# Patient Record
Sex: Male | Born: 1955 | Race: Black or African American | Hispanic: No | Marital: Single | Smoking: Never smoker
Health system: Southern US, Community
[De-identification: ages and names within clinical notes are randomized; demographics above are authoritative.]

## PROBLEM LIST (undated history)

## (undated) DIAGNOSIS — T7840XA Allergy, unspecified, initial encounter: Secondary | ICD-10-CM

## (undated) DIAGNOSIS — N200 Calculus of kidney: Secondary | ICD-10-CM

## (undated) DIAGNOSIS — J301 Allergic rhinitis due to pollen: Secondary | ICD-10-CM

## (undated) DIAGNOSIS — M79645 Pain in left finger(s): Secondary | ICD-10-CM

## (undated) HISTORY — DX: Pain in left finger(s): M79.645

## (undated) HISTORY — DX: Calculus of kidney: N20.0

## (undated) HISTORY — PX: OTHER SURGICAL HISTORY: SHX169

## (undated) HISTORY — DX: Allergy, unspecified, initial encounter: T78.40XA

---
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
## 2008-01-08 ENCOUNTER — Emergency Department (HOSPITAL_COMMUNITY): Admission: EM | Admit: 2008-01-08 | Discharge: 2008-01-08 | Payer: Self-pay | Admitting: Emergency Medicine

## 2008-01-23 ENCOUNTER — Emergency Department (HOSPITAL_COMMUNITY): Admission: EM | Admit: 2008-01-23 | Discharge: 2008-01-23 | Payer: Self-pay | Admitting: Emergency Medicine

## 2009-03-03 ENCOUNTER — Ambulatory Visit: Payer: Self-pay | Admitting: Internal Medicine

## 2009-04-08 ENCOUNTER — Ambulatory Visit: Payer: Self-pay | Admitting: Family Medicine

## 2009-10-12 DIAGNOSIS — N200 Calculus of kidney: Secondary | ICD-10-CM

## 2009-10-12 HISTORY — DX: Calculus of kidney: N20.0

## 2010-07-01 ENCOUNTER — Emergency Department: Payer: Self-pay | Admitting: Emergency Medicine

## 2011-07-06 LAB — CBC
Platelets: 194
RDW: 13.1
WBC: 8.8

## 2011-07-06 LAB — DIFFERENTIAL
Basophils Absolute: 0
Lymphocytes Relative: 22
Lymphs Abs: 1.9
Neutrophils Relative %: 68

## 2011-07-06 LAB — URINALYSIS, ROUTINE W REFLEX MICROSCOPIC
Ketones, ur: NEGATIVE
Nitrite: NEGATIVE
Protein, ur: NEGATIVE
Urobilinogen, UA: 0.2
pH: 6

## 2011-07-06 LAB — BASIC METABOLIC PANEL
BUN: 13
Calcium: 9
Creatinine, Ser: 1.11
GFR calc non Af Amer: >60

## 2011-07-07 LAB — URINALYSIS, ROUTINE W REFLEX MICROSCOPIC
Glucose, UA: NEGATIVE
Leukocytes, UA: NEGATIVE
Nitrite: NEGATIVE
Protein, ur: NEGATIVE
pH: 6

## 2011-07-07 LAB — URINE MICROSCOPIC-ADD ON

## 2011-07-07 LAB — URINE CULTURE: Culture: NO GROWTH

## 2011-10-30 ENCOUNTER — Encounter: Payer: Self-pay | Admitting: Family Medicine

## 2011-11-16 ENCOUNTER — Ambulatory Visit (INDEPENDENT_AMBULATORY_CARE_PROVIDER_SITE_OTHER): Payer: Self-pay | Admitting: Family Medicine

## 2011-11-16 ENCOUNTER — Encounter: Payer: Self-pay | Admitting: Family Medicine

## 2011-11-16 DIAGNOSIS — Z23 Encounter for immunization: Secondary | ICD-10-CM

## 2011-11-16 DIAGNOSIS — J309 Allergic rhinitis, unspecified: Secondary | ICD-10-CM | POA: Insufficient documentation

## 2011-11-16 DIAGNOSIS — A6002 Herpesviral infection of other male genital organs: Secondary | ICD-10-CM

## 2011-11-16 DIAGNOSIS — A6000 Herpesviral infection of urogenital system, unspecified: Secondary | ICD-10-CM

## 2011-11-16 DIAGNOSIS — Z0000 Encounter for general adult medical examination without abnormal findings: Secondary | ICD-10-CM

## 2011-11-16 LAB — COMPREHENSIVE METABOLIC PANEL
ALT: 34 U/L (ref 0–53)
AST: 22 U/L (ref 0–37)
Albumin: 4.3 g/dL (ref 3.5–5.2)
Alkaline Phosphatase: 65 U/L (ref 39–117)
BUN: 17 mg/dL (ref 6–23)
Calcium: 9.3 mg/dL (ref 8.4–10.5)
Chloride: 106 mEq/L (ref 96–112)
Potassium: 4.3 mEq/L (ref 3.5–5.3)
Sodium: 140 mEq/L (ref 135–145)
Total Protein: 7 g/dL (ref 6.0–8.3)

## 2011-11-16 LAB — CBC
Platelets: 195 10*3/uL (ref 150–400)
RBC: 5.76 MIL/uL (ref 4.22–5.81)
RDW: 13.4 % (ref 11.5–15.5)
WBC: 7.8 10*3/uL (ref 4.0–10.5)

## 2011-11-16 LAB — POCT GLYCOSYLATED HEMOGLOBIN (HGB A1C): Hemoglobin A1C: 5.8

## 2011-11-16 LAB — LIPID PANEL
HDL: 39 mg/dL — ABNORMAL LOW (ref >39)
LDL Cholesterol: 132 mg/dL — ABNORMAL HIGH (ref 0–99)

## 2011-11-16 MED ORDER — SODIUM CHLORIDE 0.65 % NA SOLN: 1.0000 | NASAL | Status: DC | PRN

## 2011-11-16 MED ORDER — VALACYCLOVIR HCL 1 G PO TABS: 1000.0000 mg | ORAL_TABLET | Freq: Every day | ORAL | Status: DC

## 2011-11-16 MED ORDER — FLUTICASONE PROPIONATE 50 MCG/ACT NA SUSP: 2.0000 | Freq: Every day | NASAL | Status: DC

## 2011-11-16 NOTE — Progress Notes (Signed)
  Subjective:    Patient ID: Roger Hart, male    DOB: 1955-10-13, 56 y.o.   MRN: [id number]  HPI 56 yo M new patient here to establish primary care an discuss the following:  1. Allergies: chronic. Usually during winter months. Patient is usually treated with a dose of antibiotics although he does not endorse associated fever or purulent nasal discharge. Major features are R frontal and paranasal sinus pain and bilateral. He also has droopy and dark under eyes.   2. Lesions on penis: present off and on for 10 years. Raw feeling proceeds the development of small lesion that form small sores. He denies ithching, burning or urethral discharge.    Review of Systems  Constitutional: Negative for fever, chills, diaphoresis, activity change, appetite change, fatigue and unexpected weight change.  HENT: Positive for congestion, facial swelling, rhinorrhea, sneezing, neck pain, neck stiffness, postnasal drip and sinus pressure. Negative for hearing loss, ear pain, nosebleeds, sore throat, drooling, mouth sores, trouble swallowing, dental problem, voice change, tinnitus and ear discharge.        L sided neck pain.    Eyes: Positive for redness, itching and visual disturbance. Negative for photophobia, pain and discharge.       Difficulty seeing smaller letters when reading.   Respiratory: Negative.   Cardiovascular: Negative.   Gastrointestinal: Negative.        Gassy.   Genitourinary: Positive for genital sores. Negative for dysuria, urgency, frequency, hematuria, flank pain, decreased urine volume, discharge, penile swelling, scrotal swelling, enuresis, difficulty urinating, penile pain and testicular pain.  Musculoskeletal: Positive for myalgias and arthralgias. Negative for back pain, joint swelling and gait problem.       R thumb.  L arm comes and goes.   Skin: Positive for rash. Negative for color change, pallor and wound.       Small blisters penis go and come. Can be a little painful. No  urethral discharge.   Neurological: Positive for numbness and headaches. Negative for dizziness, tremors, seizures, syncope, facial asymmetry, speech difficulty, weakness and light-headedness.       Sinus area headaches.  Occasional leg numbness, like it goes to sleeps (both legs sometimes).   Hematological: Negative.   Psychiatric/Behavioral: Negative.       Objective:   Physical Exam BP 130/80  Pulse 76  Temp(Src) 98.6 F (37 C) (Oral)  Ht 5\' 11"  (1.803 m)  Wt 244 lb 8 oz (110.904 kg)  BMI 34.10 kg/m2 General appearance: alert, cooperative and no distress Head: Normocephalic, without obvious abnormality, atraumatic, sinuses nontender to percussion Eyes: positive findings: eyelids/periorbital: lower eyelid fullness and darkness.  Nose: Nares normal. Septum midline. Mucosa edema.  No drainage or sinus tenderness. Throat: lips, mucosa, and tongue normal; teeth and gums normal Neck: no adenopathy, no carotid bruit, no JVD, supple, symmetrical, trachea midline and thyroid not enlarged, symmetric, no tenderness/mass/nodules Lungs: clear to auscultation bilaterally Heart: regular rate and rhythm, S1, S2 normal, no murmur, click, rub or gallop Male genitalia: abnormal findings: shallow ulcer on distal shaft ( R sided). Uncircumcised. No debris. Normalpalable tesets, nontender. No hernia. Pulses: 2+ and symmetric Neurologic: Grossly normal    Assessment & Plan:

## 2011-11-16 NOTE — Patient Instructions (Addendum)
[name],  Thank you for coming in to see me today.  Please pick up the flonase and use nasal saline liberally.   Fo genital herpes take Valtrex:   Initial episode: 1 g twice daily for 10 days  Recurrent episode: 500 mg twice daily for 3 days   Dr. Armen Pickup   Genital Herpes Genital herpes is a sexually transmitted disease. This means that it is a disease passed by having sex with an infected person. There is no cure for genital herpes. The time between attacks can be months to years. The virus may live in a person but produce no problems (symptoms). This infection can be passed to a baby as it travels down the birth canal (vagina). In a newborn, this can cause central nervous system damage, eye damage, or even death. The virus that causes genital herpes is usually HSV-2 virus. The virus that causes oral herpes is usually HSV-1. The diagnosis (learning what is wrong) is made through culture results. SYMPTOMS  Usually symptoms of pain and itching begin a few days to a week after contact. It first appears as small blisters that progress to small painful ulcers which then scab over and heal after several days. It affects the outer genitalia, birth canal, cervix, penis, anal area, buttocks, and thighs. HOME CARE INSTRUCTIONS      Keep ulcerated areas dry and clean.   Take medications as directed. Antiviral medications can speed up healing. They will not prevent recurrences or cure this infection. These medications can also be taken for suppression if there are frequent recurrences.   While the infection is active, it is contagious. Avoid all sexual contact during active infections.   Condoms may help prevent spread of the herpes virus.   Practice safe sex.   Wash your hands thoroughly after touching the genital area.   Avoid touching your eyes after touching your genital area.   Inform your caregiver if you have had genital herpes and become pregnant. It is your responsibility to insure  a safe outcome for your baby in this pregnancy.   Only take over-the-counter or prescription medicines for pain, discomfort, or fever as directed by your caregiver.  SEEK MEDICAL CARE IF:   You have a recurrence of this infection.   You do not respond to medications and are not improving.   You have new sources of pain or discharge which have changed from the original infection.   You have an oral temperature above 102 F (38.9 C).   You develop abdominal pain.   You develop eye pain or signs of eye infection.  Document Released: 09/25/2000 Document Revised: 06/10/2011 Document Reviewed: 10/16/2009 [facility] Patient Information 2012 [place], [place].    Are there any precautions when using this drug?  . ZOX:WRUE:A540:[id number] If you have kidney disease, talk with your doctor.  . WGN:FAOZ:H086:[id number] Check all drugs you are taking with your doctor. This drug may not mix well with some other drugs.  . XBM:WUXL:K440:[id number] Keep a list of all your drugs (prescription, natural products, vitamins, OTC) with you. Give this list to your doctor.  . UYQ:IHKV:Q259:[id number] Tell your doctor if you are pregnant or plan on getting pregnant.  . PPI:RJJO:A416:[id number] Tell your doctor if you are breast-feeding. What are some side effects of this drug?  . FUX:NATF:T732:[id number] Headache.  . WCB:JSEG:B151:[id number] Upset stomach or throwing up. Many small meals, good mouth care, sucking hard, sugar-free candy, or chewing sugar-free gum may help.  . TGG:YIRS:W546:[id number] Belly pain.  . FGH:WEXH:B716:[id number] Low white  blood cell count.  . ZOX:WRUE:A540:[id number] Raised liver enzymes. When do I need to call my doctor   Valtrex: Are there any precautions when using this drug?  . WGN:FAOZ:H086:[id number] If you have kidney disease, talk with your doctor.  . XBM:WUXL:K440:[id number] Check all drugs you are taking with your doctor. This drug may not mix well with some other drugs.  .  UYQ:IHKV:Q259:[id number] Keep a list of all your drugs (prescription, natural products, vitamins, OTC) with you. Give this list to your doctor.  . PPI:RJJO:A416:[id number] Tell your doctor if you are pregnant or plan on getting pregnant.  . FUX:NATF:T732:[id number] Tell your doctor if you are breast-feeding. What are some side effects of this drug?  . WCB:JSEG:B151:[id number] Headache.  . TGG:YIRS:W546:[id number] Upset stomach or throwing up. Many small meals, good mouth care, sucking hard, sugar-free candy, or chewing sugar-free gum may help.  . FGH:WEXH:B716:R6789381 Belly pain.  . OFB:PZWC:H852:[id number] Low white blood cell count.  . TIR:WERX:V400:[id number] Raised liver enzymes.

## 2011-11-17 LAB — RPR

## 2011-11-17 LAB — HIV ANTIBODY (ROUTINE TESTING W REFLEX): HIV: NONREACTIVE

## 2011-11-23 ENCOUNTER — Telehealth: Payer: Self-pay | Admitting: Family Medicine

## 2011-11-23 NOTE — Telephone Encounter (Signed)
Left VM. Informed patient that A1c and cholesterol were wnl. Other labs (HIV and RPR) negative. Asked patient to call back if he wanted a letter or had any questions about his blood work.

## 2011-11-26 NOTE — Assessment & Plan Note (Signed)
A: suspect allergic rhinitis. Patient afebrile, no evidence of active infection. P:  -flonase -liberal use of nasal saline.

## 2011-11-26 NOTE — Assessment & Plan Note (Addendum)
A: lesion on penis consistent with genital herpes ulcer.  P: treat with valcyclovir. Discussed treatment for recurrent outbreaks. Monitor response to treatment.

## 2012-02-03 ENCOUNTER — Ambulatory Visit (HOSPITAL_COMMUNITY)
Admission: RE | Admit: 2012-02-03 | Discharge: 2012-02-03 | Disposition: A | Discharge status: Home / Self Care | Payer: Self-pay | Source: Ambulatory Visit | Attending: Family Medicine | Admitting: Family Medicine

## 2012-02-03 ENCOUNTER — Ambulatory Visit (INDEPENDENT_AMBULATORY_CARE_PROVIDER_SITE_OTHER): Payer: Self-pay | Admitting: Family Medicine

## 2012-02-03 ENCOUNTER — Telehealth: Payer: Self-pay | Admitting: Family Medicine

## 2012-02-03 VITALS — BP 118/77 | HR 73 | Temp 97.8°F | Ht 71.0 in | Wt 244.4 lb

## 2012-02-03 DIAGNOSIS — M79645 Pain in left finger(s): Secondary | ICD-10-CM | POA: Insufficient documentation

## 2012-02-03 DIAGNOSIS — X58XXXA Exposure to other specified factors, initial encounter: Secondary | ICD-10-CM | POA: Insufficient documentation

## 2012-02-03 DIAGNOSIS — M2569 Stiffness of other specified joint, not elsewhere classified: Secondary | ICD-10-CM | POA: Insufficient documentation

## 2012-02-03 DIAGNOSIS — S62639A Displaced fracture of distal phalanx of unspecified finger, initial encounter for closed fracture: Secondary | ICD-10-CM | POA: Insufficient documentation

## 2012-02-03 DIAGNOSIS — M79609 Pain in unspecified limb: Secondary | ICD-10-CM | POA: Insufficient documentation

## 2012-02-03 HISTORY — DX: Pain in left finger(s): M79.645

## 2012-02-03 NOTE — Assessment & Plan Note (Signed)
Fracture of left 5th digit on volar surface of distal phalanx. I recommend splinting for 2-3 weeks and avoiding impact if possible. Follow-up for persistent pain.

## 2012-02-03 NOTE — Progress Notes (Signed)
  Subjective:    Patient ID: Roger Hart, male    DOB: 09-28-1956, 56 y.o.   MRN: [id number]  HPI Patient caught left hand between two wooden panels of a garage door as it was closing. This occurred this morning. Currently he has pain in the DIP joints and finger tips of index, middle, and ring fingers of his left hand. There was no laceration or deformity. 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 Additionally, he is concerned about the thumb of his right hand, because he has difficulty flexing it at the IP joint. This was has been long standing and not related to today's injury to the left hand. He denies previous injury or surgery to the right hand.    Review of Systems Negative unless stated in HPI.     Objective:   Physical Exam BP 118/77  Pulse 73  Temp(Src) 97.8 F (36.6 C) (Oral)  Ht 5\' 11"  (1.803 m)  Wt 244 lb 6.4 oz (110.859 kg)  BMI 34.09 kg/m2 Gen: alert, well appearind Hands: intact pulses of right of right hand; no lacerations; mild bruising of the base of the nail bed at the index and middle fingers; no obvious deformity; decreased active flexion of DIP joints of middle 3 fingers of left compared to right; Right hand - cannot active flex IP joint of thumb; no obvious deformity or evidence of arthritis   X-ray left hand Small fracture fragment at the volar base of the left fifth distal  phalanx. No other acute fracture or dislocation identified in the  left hand.  X-ray right thumb Negative for injury to thumb    Assessment & Plan:  56 year old man who sustained fracture of the left 5th digit at the base of the distal phalanx.

## 2012-02-03 NOTE — Telephone Encounter (Signed)
I called Roger Hart to discuss the result of his X-ray. He did not answer, so I left a message with the following details. The X-ray demonstrated a very small non-displaced fracture of the left 5th digit. I recommended that he splint it. He was also instructed to call the clinic with questions. Since I working in the hospital all day tomorrow, any questions about this patient should be routed to a preceptor in the clinic. Thank you.

## 2012-02-03 NOTE — Patient Instructions (Signed)
Dear Mr. Pitner,   Thank you for coming to clinic today. Please read below regarding the issues that we discussed.   1. Finger caught in the garage door - I recommend an X-ray to make sure that there is not a fracture.  2. Inability to flex the thumb - This could be the result of arthritis or in injury to the tendon on your thumb. Most likely, it is arthritis. We will get an x-ray of this as well.   I will call you with the results of the X-ray and let you know what needs to be done if they are fractured. Please follow up in clinic in as needed to see Dr. Armen Pickup. Please call earlier if you have any questions or concerns.   Sincerely,   Dr. Clinton Sawyer

## 2012-02-04 ENCOUNTER — Telehealth: Payer: Self-pay | Admitting: Family Medicine

## 2012-02-04 NOTE — Telephone Encounter (Signed)
Patient told of fracture to left 5th digit. He states that it is already splinted. Also discussed normal X-ray of right thumb. If he still has problems, he can discuss referral to hand surgeon with PCP at next visit.

## 2012-02-04 NOTE — Telephone Encounter (Signed)
Discussed note with Dr. Mauricio Po.  Per Dr. Awilda Metro Dr. Clinton Sawyer is not post-call, will route note back to him to contact patient to discuss x-ray results.  Gaylene Brooks, RN

## 2012-02-04 NOTE — Telephone Encounter (Signed)
Returned call to patient.  States his middle finger was smashed yesterday and the nail looks different.  Not sure if he needs to have anything done to it.  Will route note to Dr. Mauricio Po (Preceptor) to discuss results with patient per Dr. Yetta Numbers request.  Gaylene Brooks, RN .

## 2012-02-04 NOTE — Telephone Encounter (Signed)
Pt smashed his hand and was seen yesterday and has a question about one of the nails on his middle finger. Wants to talk to nurse

## 2012-05-02 ENCOUNTER — Encounter (HOSPITAL_COMMUNITY): Payer: Self-pay | Admitting: *Deleted

## 2012-05-02 ENCOUNTER — Emergency Department (HOSPITAL_COMMUNITY)
Admission: EM | Admit: 2012-05-02 | Discharge: 2012-05-02 | Disposition: A | Discharge status: Home / Self Care | Payer: Self-pay | Attending: Emergency Medicine | Admitting: Emergency Medicine

## 2012-05-02 DIAGNOSIS — M436 Torticollis: Secondary | ICD-10-CM | POA: Insufficient documentation

## 2012-05-02 MED ORDER — METHOCARBAMOL 500 MG PO TABS: 500.0000 mg | ORAL_TABLET | Freq: Three times a day (TID) | ORAL | Status: AC

## 2012-05-02 MED ORDER — IBUPROFEN 800 MG PO TABS: 800.0000 mg | ORAL_TABLET | Freq: Three times a day (TID) | ORAL | Status: AC

## 2012-05-02 MED ORDER — KETOROLAC TROMETHAMINE 60 MG/2ML IM SOLN
60.0000 mg | Freq: Once | INTRAMUSCULAR | Status: AC
  Administered 2012-05-02: 60 mg via INTRAMUSCULAR
  Filled 2012-05-02: qty 2

## 2012-05-02 MED ORDER — DIAZEPAM 5 MG PO TABS
5.0000 mg | ORAL_TABLET | Freq: Once | ORAL | Status: AC
  Administered 2012-05-02: 5 mg via ORAL
  Filled 2012-05-02: qty 1

## 2012-05-02 NOTE — ED Notes (Signed)
Pt verbalizes understanding 

## 2012-05-02 NOTE — ED Provider Notes (Signed)
History     CSN: [id number]  Arrival date & time 05/02/12  1956   First MD Initiated Contact with Patient 05/02/12 2138      Chief Complaint  Patient presents with  . Neck Pain    (Consider location/radiation/quality/duration/timing/severity/associated sxs/prior treatment) HPI Comments: Patient presents with left-sided neck pain. He reports that this has been intermittent in nature for the past 3-4 weeks. Today, he developed increased pain to the side of his neck which has been more intense than previous. No known injury. He reports increased pain with range of motion of his neck. Pain radiates down to his shoulder. Pt denies CP, SOB.  Patient is a 56 y.o. male presenting with neck injury. The history is provided by the patient.  Neck Injury This is a new problem. The current episode started yesterday. The problem occurs constantly. The problem has been gradually worsening. Associated symptoms include neck pain. Pertinent negatives include no chest pain, fever, nausea, numbness, rash or weakness. Nothing aggravates the symptoms. He has tried nothing for the symptoms. The treatment provided no relief.    Past Medical History  Diagnosis Date  . Allergy 1960    hay fever   . Kidney stones 2011    Past Surgical History  Procedure Date  . None     Family History  Problem Relation Age of Onset  . Asthma Mother   . Cancer Mother 29    uterine cancer   . Seizures Mother   . Cancer Maternal Aunt   . Dementia Father 59    Alzheimers   . Cancer Maternal Uncle     Colon cancer, multiple individuals.     History  Substance Use Topics  . Smoking status: Never Smoker   . Smokeless tobacco: Never Used  . Alcohol Use: Yes     socially, once a month.       Review of Systems  Constitutional: Negative for fever.  HENT: Positive for neck pain.   Cardiovascular: Negative for chest pain.  Gastrointestinal: Negative for nausea.  Skin: Negative for rash.  Neurological: Negative  for weakness and numbness.    Allergies  Review of patient's allergies indicates no known allergies.  Home Medications   Current Outpatient Rx  Name Route Sig Dispense Refill  . FLUTICASONE PROPIONATE 50 MCG/ACT NA SUSP Nasal Place 2 sprays into the nose daily. 16 g 6  . SODIUM CHLORIDE 0.65 % NA SOLN Nasal Place 1 spray into the nose as needed for congestion. 15 mL 12    BP 136/84  Pulse 62  Temp 98.8 F (37.1 C)  Resp 20  SpO2 97%  Physical Exam  Nursing note and vitals reviewed. Constitutional: He appears well-developed and well-nourished. No distress.  HENT:  Head: Normocephalic and atraumatic.  Right Ear: External ear normal.  Left Ear: External ear normal.  Mouth/Throat: Oropharynx is clear and moist.  Eyes:       Normal appearance  Neck: Normal range of motion.       Pt with spasm of neck - head tilted toward left shoulder. Negative meningeal signs, no nuchal rigidity. Pt able to range chin to chest without difficulty. Pain elicited with attempting to touch R ear to shoulder. Palp spasm along trapezius  Cardiovascular: Normal rate, regular rhythm and normal heart sounds.   Pulmonary/Chest: Effort normal and breath sounds normal. He exhibits no tenderness.  Musculoskeletal: Normal range of motion.  Neurological: He is alert.  Skin: Skin is warm and dry. He is not  diaphoretic.  Psychiatric: He has a normal mood and affect.    ED Course  Procedures (including critical care time)  Labs Reviewed - No data to display No results found.   1. Torticollis       MDM  Pt's presentation appears c/w torticollis; palp spasm of trap. Prescriptions given for ibuprofen, musc relaxers. Instructed to f/u with PCP should sx persist. RICE discussed. Reasons to return to ED discussed.       Grant Fontana, PA-C 05/04/12 0105

## 2012-05-02 NOTE — ED Notes (Signed)
Pt c/o stiffness on left side of neck down to shoulder x 3-4 wks.  No known injury.  Today increased pain around back of neck; increased pain with turning neck

## 2012-05-04 NOTE — ED Provider Notes (Signed)
Medical screening examination/treatment/procedure(s) were performed by non-physician practitioner and as supervising physician I was immediately available for consultation/collaboration.  Gerhard Munch, MD 05/04/12 2351

## 2012-06-16 ENCOUNTER — Encounter: Payer: Self-pay | Admitting: Family Medicine

## 2012-06-16 ENCOUNTER — Ambulatory Visit (INDEPENDENT_AMBULATORY_CARE_PROVIDER_SITE_OTHER): Payer: Self-pay | Admitting: Family Medicine

## 2012-06-16 VITALS — BP 132/80 | HR 69 | Temp 98.1°F | Ht 71.0 in | Wt 244.0 lb

## 2012-06-16 DIAGNOSIS — M542 Cervicalgia: Secondary | ICD-10-CM | POA: Insufficient documentation

## 2012-06-16 DIAGNOSIS — J309 Allergic rhinitis, unspecified: Secondary | ICD-10-CM

## 2012-06-16 MED ORDER — IBUPROFEN 800 MG PO TABS: 800.0000 mg | ORAL_TABLET | Freq: Three times a day (TID) | ORAL | Status: DC | PRN

## 2012-06-16 MED ORDER — FLUTICASONE PROPIONATE 50 MCG/ACT NA SUSP: 2.0000 | Freq: Every day | NASAL | Status: DC

## 2012-06-16 MED ORDER — METHOCARBAMOL 500 MG PO TABS: 500.0000 mg | ORAL_TABLET | Freq: Three times a day (TID) | ORAL | Status: DC | PRN

## 2012-06-16 NOTE — Patient Instructions (Addendum)
[name],  Thank you for coming in today. Please restart flonase.   I have refilled robaxin and motrin to take as needed.  If you develop worsening drainage, headache or fever call and come in as this likely means you need a course of antibiotics.  Dr. Armen Pickup

## 2012-06-16 NOTE — Assessment & Plan Note (Signed)
A: persistent. Improved with nasal saline. No evidence of bacterial sinusitis.  P:  Continue nasal saline Restart flonase.

## 2012-06-16 NOTE — Progress Notes (Signed)
Subjective:     Patient ID: Roger Hart, male   DOB: [birth year], 56 y.o.   MRN: [id number]  HPI 56 yo presents for f/u visit to discuss the following:  1. Neck pain: started 05/02/12. Improved with robaxin and advil. Also improved when patient stopped sleeping on the floor. Still has occasional pain but not as severe as before.   2. Nasal congestion/rhinorrhea: chronic. Worsened last month. Associated with L frontal headache. Improved with nasal saline. Patient not taking flonase. Denies fever.   Review of Systems As per HPI    Objective:   Physical Exam BP 132/80  Pulse 69  Temp 98.1 F (36.7 C) (Oral)  Ht 5\' 11"  (1.803 m)  Wt 244 lb (110.678 kg)  BMI 34.03 kg/m2 General appearance: alert, cooperative and no distress Head: Normocephalic, without obvious abnormality, atraumatic Eyes: conjunctivae/corneas clear. PERRL, EOM's intact.  Ears: normal TM's and external ear canals both ears Nose: no discharge, turbinates pink, swollen Throat: lips, mucosa, and tongue normal; teeth and gums normal Neck: no adenopathy, no carotid bruit, no JVD, supple, symmetrical, trachea midline and thyroid not enlarged, symmetric, no tenderness/mass/nodules     Assessment and Plan:

## 2012-06-16 NOTE — Assessment & Plan Note (Signed)
A: improved. L sided neck pain worsened by physical job. P: Continue robaxin and ibuprofen prn.  Ice and heat.

## 2013-02-17 ENCOUNTER — Encounter: Payer: Self-pay | Admitting: Family Medicine

## 2013-02-17 ENCOUNTER — Ambulatory Visit (INDEPENDENT_AMBULATORY_CARE_PROVIDER_SITE_OTHER): Payer: Self-pay | Admitting: Family Medicine

## 2013-02-17 VITALS — BP 142/64 | HR 73 | Temp 98.4°F | Ht 71.0 in | Wt 245.0 lb

## 2013-02-17 DIAGNOSIS — A6002 Herpesviral infection of other male genital organs: Secondary | ICD-10-CM

## 2013-02-17 DIAGNOSIS — W57XXXA Bitten or stung by nonvenomous insect and other nonvenomous arthropods, initial encounter: Secondary | ICD-10-CM

## 2013-02-17 DIAGNOSIS — T148 Other injury of unspecified body region: Secondary | ICD-10-CM

## 2013-02-17 DIAGNOSIS — A6000 Herpesviral infection of urogenital system, unspecified: Secondary | ICD-10-CM

## 2013-02-17 MED ORDER — VALACYCLOVIR HCL 1 G PO TABS: 1000.0000 mg | ORAL_TABLET | Freq: Every day | ORAL | Status: AC

## 2013-02-17 NOTE — Progress Notes (Signed)
Patient ID: Roger Hart, male   DOB: [birth year], 57 y.o.   MRN: [id number] Subjective: The patient is a 57 y.o. year old male who presents today for complaints.  About a week ago patient was outside and, in the evening, found that he had 2 small tics on his inner legs. These have been on him for less than 12 hours. He promptly removed including the head. He had a small amount of inflammation around the sites of the tick bites that went down several days. He has been continuing to have a small amount of myalgias in his legs in the area around the tick bites. He is not having any fevers. There are no rashes.  Of note, the patient was given a prescription for Valtrex about a year ago. He never filled this. He is wondering if he could get this refilled.  Patient's past medical, social, and family history were reviewed and updated as appropriate. History  Substance Use Topics  . Smoking status: Never Smoker   . Smokeless tobacco: Never Used  . Alcohol Use: Yes     Comment: socially, once a month.    Objective:  Filed Vitals:   02/17/13 0937  BP: 142/64  Pulse: 73  Temp: 98.4 F (36.9 C)   Gen: No acute distress Skin: Patient has several well-healing insect bites on his inner thighs. There is no erythema. There is no pain with palpation. There is no evidence of erythema migrans.  Assessment/Plan: Tick bites. They have been present for less than 36 hours. Accordingly no antibiotic prophylaxis is indicated. Additionally he is outside of the window of time for antibiotic prophylaxis. I've counseled the patient on the appearance of rashes that should prompt return. I also informed him that if his myalgias persist for another 1-2 weeks I would recommend that he return to clinic to have some basic lab work drawn.  Please also see individual problems in problem list for problem-specific plans.

## 2013-04-26 IMAGING — CR DG FINGER THUMB 2+V*R*
3 series · 3 of 3 positions shown · non-contrast
Comparison: None.

CLINICAL DATA: 55-year-old male with thumb pain radiating to the
wrist with stiffness.  No known injury.

RIGHT THUMB 2+V

[x finger pa right]
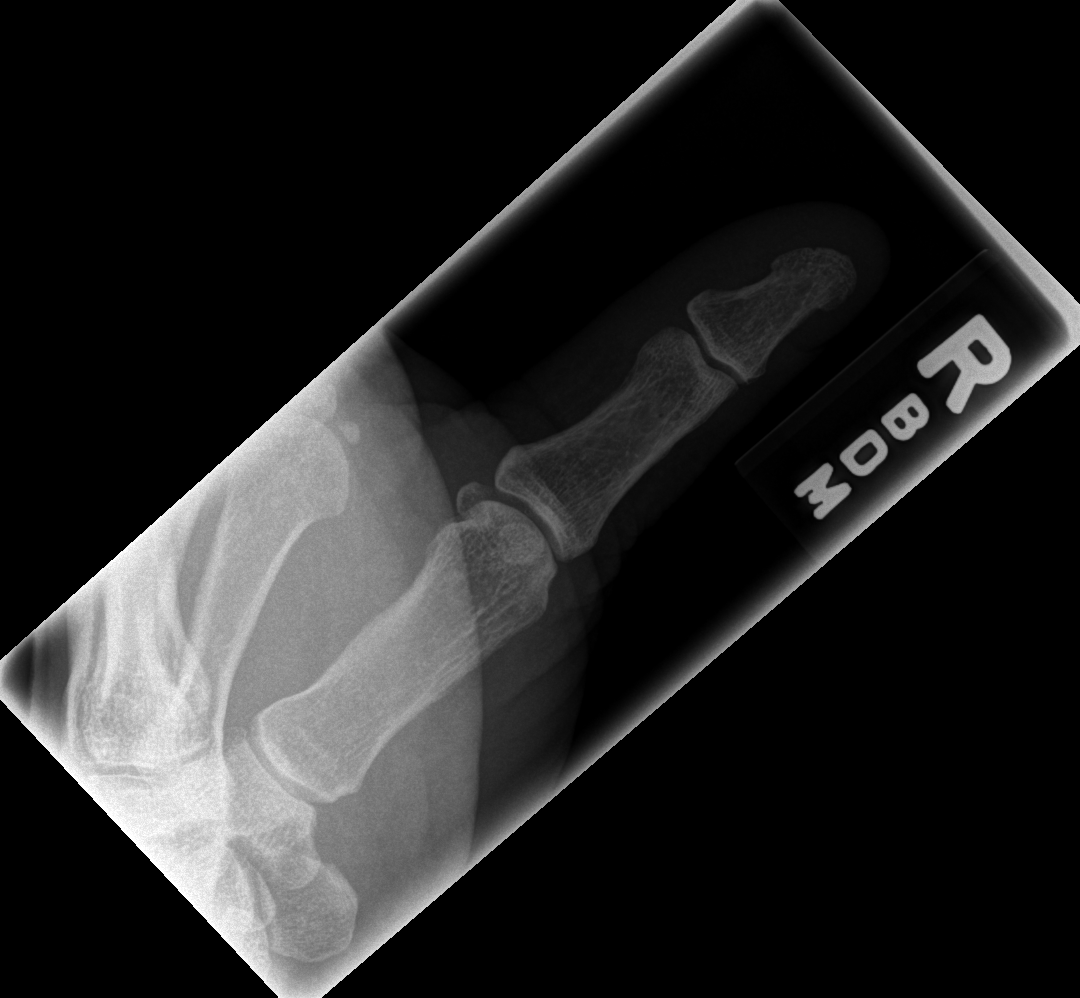

[x finger obl. right]
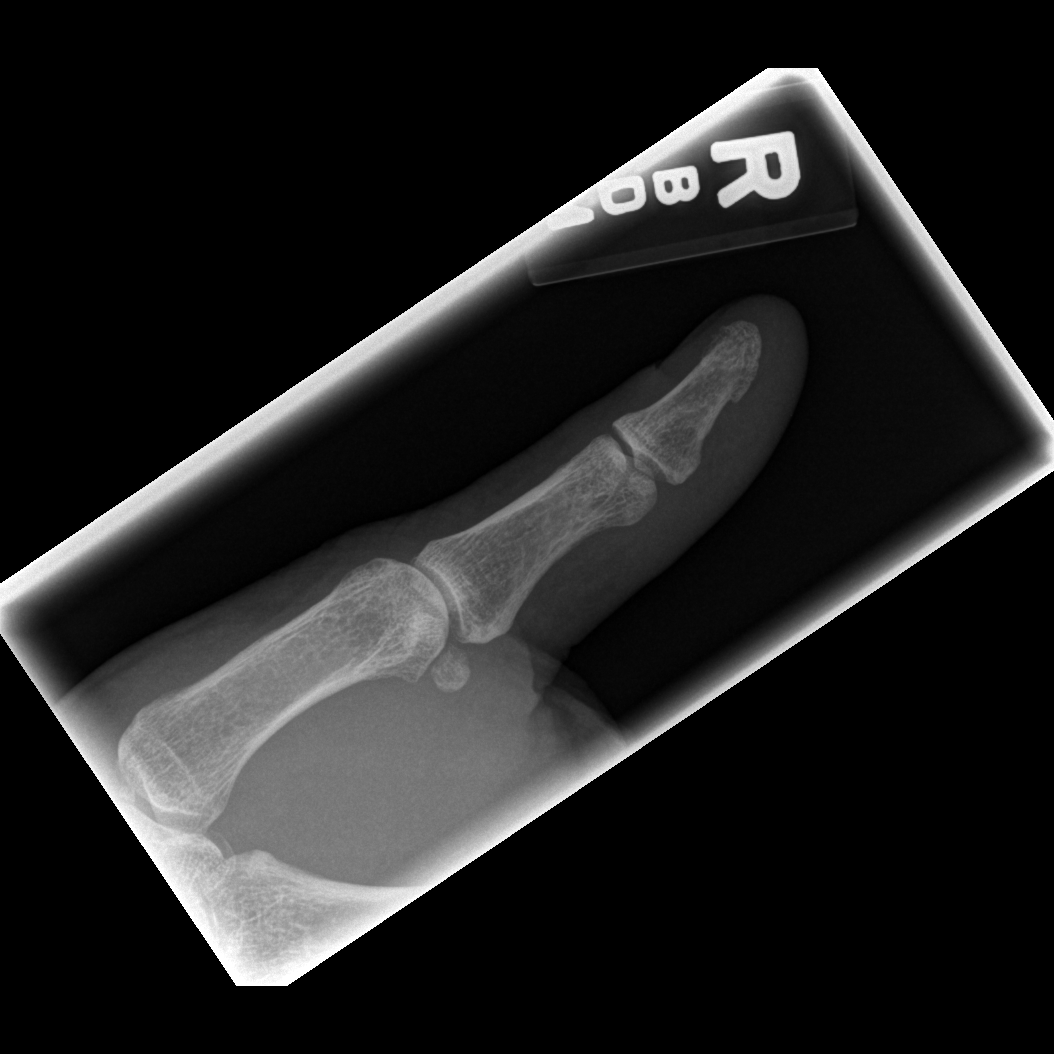

[x finger lateral right]
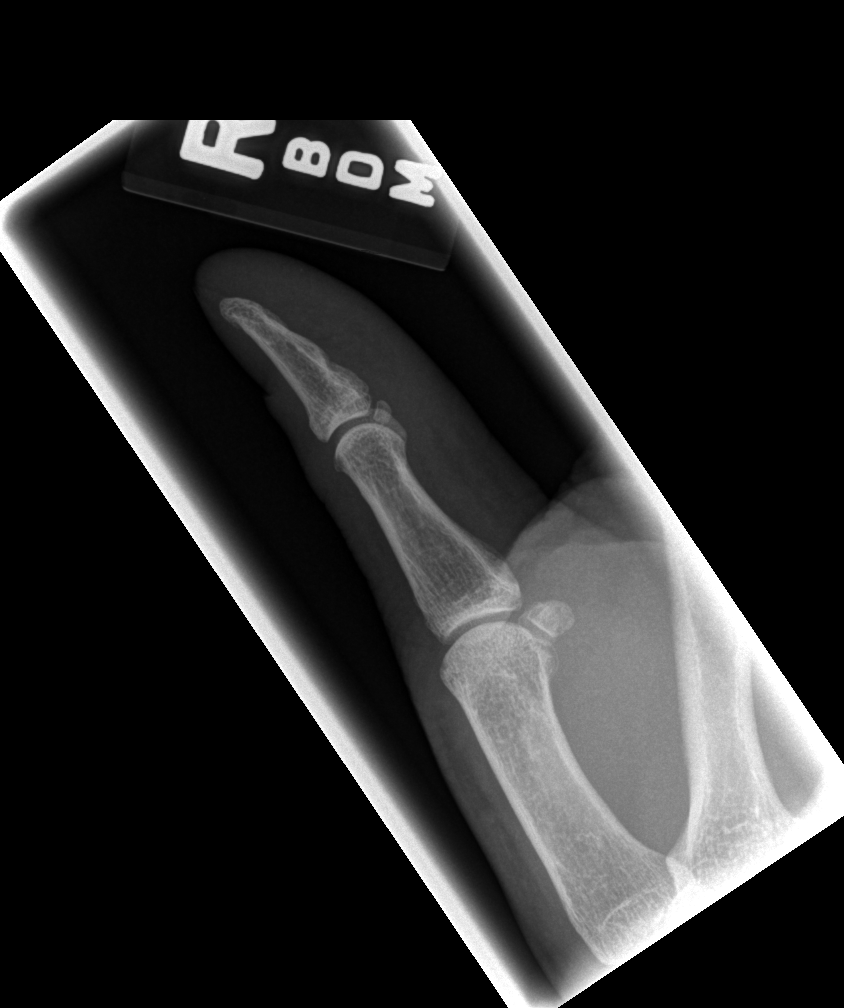

[3 of 3 positions shown; findings below may reference images not displayed]

FINDINGS: Bone mineralization is within normal limits.  Joint
spaces are preserved.  No periarticular erosions.  No fracture or
dislocation.  Thumb metacarpal joint appears within normal limits.
IMPRESSION: Negative radiographic appearance of the right thumb.

## 2013-04-26 IMAGING — CR DG HAND 2V*L*
2 series · 2 of 2 positions shown · non-contrast
Comparison: None.

CLINICAL DATA: 55-year-old male with blunt trauma and pain.

LEFT HAND - 2 VIEW

[x hand pa left]
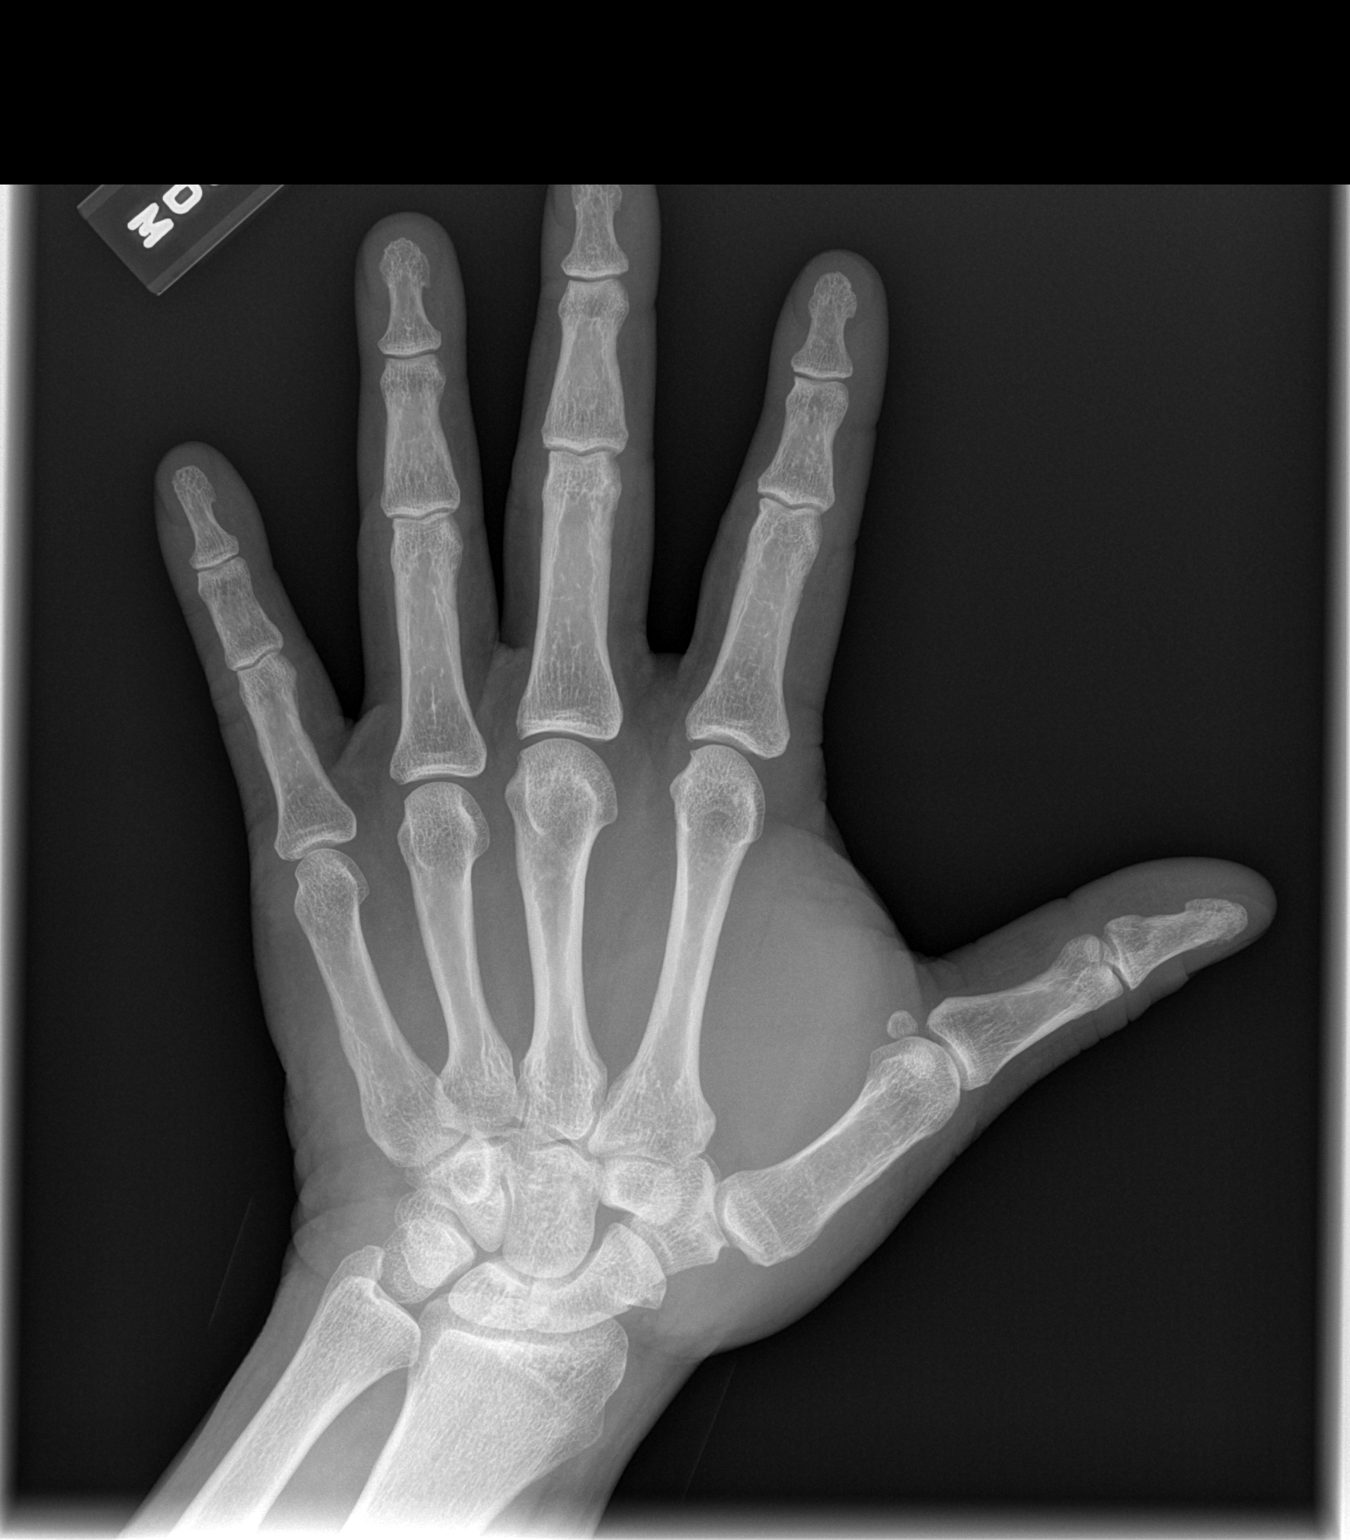

[x hand lat left]
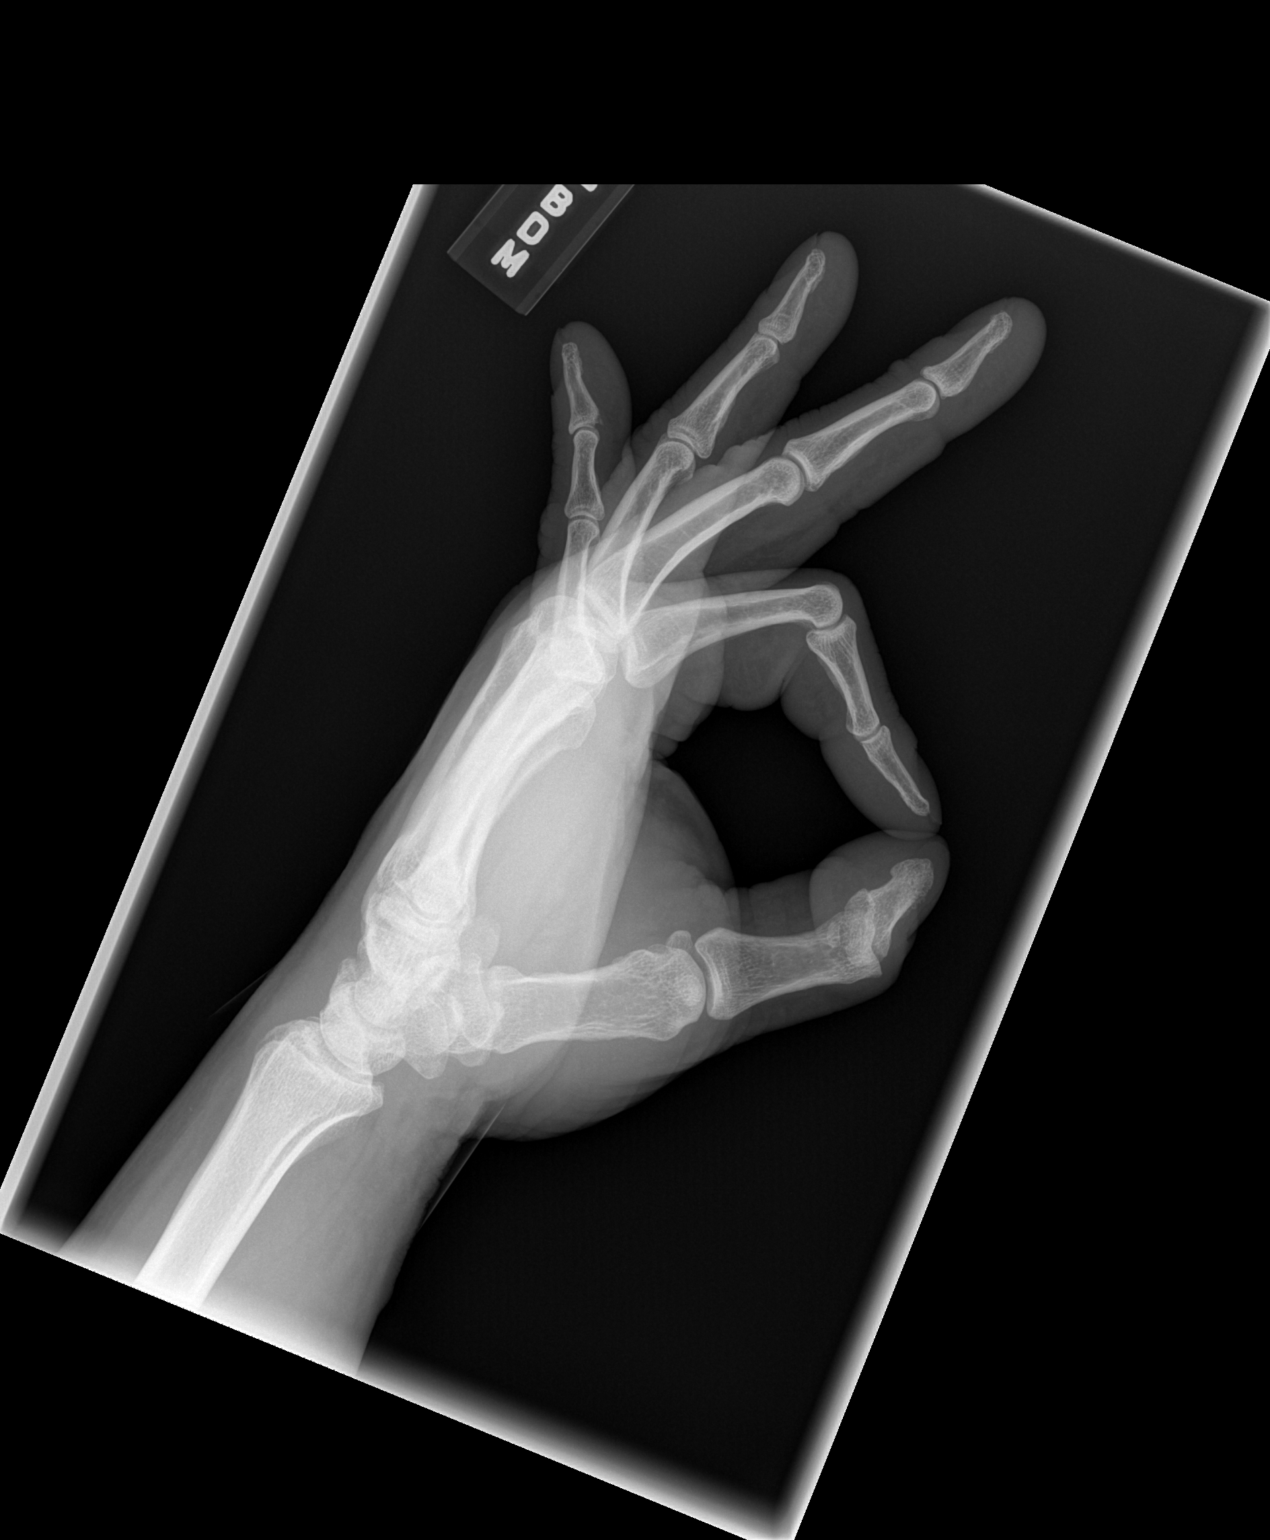

[2 of 2 positions shown; findings below may reference images not displayed]

FINDINGS: Bone mineralization is within normal limits.  Distal
radius and ulna intact.  Carpal bone alignment within normal
limits.  Metacarpals intact.  Joint spaces preserved.  Cortical
irregularity at the volar base of the left fifth distal phalanx may
reflect a small acute fracture.  Otherwise no fracture or
dislocation identified.
IMPRESSION: Small fracture fragment at the volar base of the left fifth distal
phalanx.  No other acute fracture or dislocation identified in the
left hand.

## 2013-09-26 ENCOUNTER — Encounter (HOSPITAL_COMMUNITY): Payer: Self-pay | Admitting: Emergency Medicine

## 2013-09-26 ENCOUNTER — Emergency Department (INDEPENDENT_AMBULATORY_CARE_PROVIDER_SITE_OTHER)
Admission: EM | Admit: 2013-09-26 | Discharge: 2013-09-26 | Disposition: A | Discharge status: Home / Self Care | Payer: Self-pay | Source: Home / Self Care

## 2013-09-26 DIAGNOSIS — B359 Dermatophytosis, unspecified: Secondary | ICD-10-CM

## 2013-09-26 MED ORDER — CLOTRIMAZOLE 1 % EX CREA: 1.0000 "application " | TOPICAL_CREAM | Freq: Two times a day (BID) | CUTANEOUS | Status: DC

## 2013-09-26 NOTE — ED Notes (Signed)
C/o rash on right side of neck x 2 months.  States started off as a bump behind ear.  Rash has spread down side of neck.  Pt has tried otc creams with no relief in itch.

## 2013-09-26 NOTE — ED Provider Notes (Signed)
Roger Hart is a 57 y.o. male who presents to Urgent Care today for rash.   Rash present on right side of his neck 2-3 months. Slowly growing in size. Started behind his ear. Seemed to get worse after he tried hydrocortisone. Seems to have a raised border and be in somewhat of a circular or oblong shape. Has never had anything like this before. Initially thought it was ringworm but states a pharmacist told him it couldn't be because it was too big. No other treatments tried. Pruritic in nature.   ROS-Denies nausea/vomiting/fever/chills/fatigue/overall sick feelings. No new medications recently. No known history of being immunocompromised. No mucus membrane involvement. No new detergents, fabric softeners, shampoos or soaps. 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  Allergies-NKDA Social history- works full time and difficult to get into PCP during regular bsuiness hours.  History  Substance Use Topics  . Smoking status: Never Smoker   . Smokeless tobacco: Never Used  . Alcohol Use: Yes     Comment: socially, once a month.    ROS as above Medications reviewed. No current facility-administered medications for this encounter.   Current Outpatient Prescriptions  Medication Sig Dispense Refill  . clotrimazole (LOTRIMIN) 1 % cream Apply 1 application topically 2 (two) times daily. Use for 2 weeks after resolution.  113 g  2  . fluticasone (FLONASE) 50 MCG/ACT nasal spray Place 2 sprays into the nose daily.  16 g  6  . ibuprofen (ADVIL,MOTRIN) 800 MG tablet Take 1 tablet (800 mg total) by mouth every 8 (eight) hours as needed for pain.  30 tablet  1  . methocarbamol (ROBAXIN) 500 MG tablet Take 1 tablet (500 mg total) by mouth 3 (three) times daily as needed.  30 tablet  1  . sodium chloride (OCEAN)  0.65 % nasal spray Place 1 spray into the nose as needed for congestion.  15 mL  12  . valACYclovir (VALTREX) 1000 MG tablet Take 1 tablet (1,000 mg total) by mouth daily.  20 tablet  11    Exam:  BP 143/89  Pulse 82  Temp(Src) 98.7 F (37.1 C) (Oral)  Resp 12  SpO2 100% Gen: Well NAD Skin: oval shaped lesion on right side of anterior neck about 20 x 10cm (fades more in hairline) with raised erythematous border with central area somewhat lighter. On outside portion of this (about 6 cm away near hairline) patient has a small nodule that appears to be subcutaneous.  HEENT: Mucous membranes are moist. Lungs: Normal work of breathing. CTABL Heart: RRR no MRG Abd:  Soft. NT, ND Exts: No edema.   Assessment and Plan: 57 y.o. male with likely ringwork (tinea corporis) given almost circular shape with raised border. No microscope available in house for confirmatory testing. Will treat with clotrimazole for 2 weeks past clearance (possible 6 weeks of treatment). F/u with primary if not improved.   Also, discussed with patient that he should follow up with PCP (me) regarding chronic health maintenance as well as the incidental finding of the subcutaneous nodule (states has been there for at least a year but he is worried about it) near his hairline. Would consider imaging vs referral to dermatology as does seem to be somewhat painful (? Isolated neurofibroma). Also, has had slightly elevated BP on last 2 visits and this will be monitored by PCP.  Shelva Majestic, MD 09/26/13 [phone]

## 2013-09-27 NOTE — ED Provider Notes (Signed)
Medical screening examination/treatment/procedure(s) were performed by resident physician or non-physician practitioner and as supervising physician I was immediately available for consultation/collaboration.   Barkley Bruns MD.   Linna Hoff, MD 09/27/13 [phone]

## 2013-10-16 ENCOUNTER — Encounter: Payer: Self-pay | Admitting: Family Medicine

## 2013-10-19 ENCOUNTER — Encounter: Payer: Self-pay | Admitting: Family Medicine

## 2013-10-23 ENCOUNTER — Encounter: Payer: Self-pay | Admitting: Family Medicine

## 2013-10-23 ENCOUNTER — Ambulatory Visit (INDEPENDENT_AMBULATORY_CARE_PROVIDER_SITE_OTHER): Payer: Self-pay | Admitting: Family Medicine

## 2013-10-23 VITALS — BP 136/86 | HR 79 | Temp 98.4°F | Ht 71.0 in | Wt 244.0 lb

## 2013-10-23 DIAGNOSIS — N508 Other specified disorders of male genital organs: Secondary | ICD-10-CM

## 2013-10-23 DIAGNOSIS — K219 Gastro-esophageal reflux disease without esophagitis: Secondary | ICD-10-CM

## 2013-10-23 DIAGNOSIS — N5089 Other specified disorders of the male genital organs: Secondary | ICD-10-CM | POA: Insufficient documentation

## 2013-10-23 MED ORDER — PANTOPRAZOLE SODIUM 40 MG PO TBEC: 40.0000 mg | DELAYED_RELEASE_TABLET | Freq: Every day | ORAL | Status: DC

## 2013-10-23 NOTE — Assessment & Plan Note (Signed)
Symptomatic off medication. Encouraged patient to restart PPI.

## 2013-10-23 NOTE — Assessment & Plan Note (Signed)
Agree with patient that regular exercise would benefit him. [facility] and encouraged 150 mins/wk.

## 2013-10-23 NOTE — Patient Instructions (Addendum)
Keep using cream until area on neck gone x 2 weeks  [facility]. 150 minutes a week of exercise would be my recommendation. Do something you enjoy that you can sustain.   Lungs sounded ok. Glad the cough is getting better.   Sent in protonix for your reflux. Take everyday.   For the enlargement in yoru scrotum. I am going to get an ultrasound.   Let me know when you get insurance and I will order the ultrasound and your labwork. Call me or send me a message.   Thanks, Dr. Durene CalHunter  See us at least yearly.    My 5 to Fitness!  5: fruits and vegetables per day (work on 9 per day if you are at 5) 4: exercise 4-5 times per week for at least 30 minutes (walking counts!) 3: meals per day (don't skip breakfast!) 2: habits to quit -smoking -excess alcohol use (men >2 beer/day; women >1beer/day) 1: sweet per day (2 cookies, 1 small cup of ice cream, 12 oz soda)  These are general tips for healthy living. Try to start with 1 or 2 habit TODAY and make it a part of your life for several months.   Once you have 1 or 2 habits down for several months, try to begin working on your next healthy habit. With every single step you take, you will be leading a healthier lifestyle!

## 2013-10-23 NOTE — Assessment & Plan Note (Signed)
At least 5 cm in length. ? Varicocele. Encouraged patient that we should get baseline ultrasound to assess. He wants to wait until he has insurance so i encouraged him to call us once he has this.

## 2013-10-23 NOTE — Progress Notes (Signed)
Tana Conch, MD Phone: [phone]  Subjective:  Chief complaint-noted  Patient presents for follow up and has a # of concerns. i saw patient at urgent care and noted he had not had f/u in our office in over a year for non-acute issues. Asked him to f/u in clinic.   Tinea Corporis/rash on neck. Patient states with clotrimazole had drastic improvement and since that time has only intermittently used. He also had a ? Subcutaneous nodule previously that has resolved. Discussed this may have been a lymph node. Patient states since ringworm has improved his neck pain has as well.   Obesity-BMI likely overestimates at 34 as patient very muscular but still carries additional weight. Wants to know if he should exercises and any tips. Advised per week. Patient wants to start at [facility] and I encouraged this.   GERD-patient used to use protonix. Since he stopped, he has sour taste in his mouth or burning in his upper chest especially when laying down after meals. Worse with spicy foods or tomato based foods.   Scrotal mass-patient states he has a "large testicle" but on further discussion, 2 normal testicles and a bulge in the right side of his scrotum noted at least since teenage years. Was told not to worry about it previously he states by other physicians. Never had any imaging. Denies personal history of testicular cancer.   ROS-no extremity weakness. Does have some trouble seeing up close but knows he needs some glasses. No unintentional weight loss/fatigue/malaise/night sweats. No chest pain or shortness of breath.   Past Medical History Patient Active Problem List   Diagnosis Date Noted  . Scrotal mass 10/23/2013    Priority: Low  . GERD (gastroesophageal reflux disease) 10/23/2013    Priority: Low  . Allergic rhinitis 11/16/2011    Priority: Low  . Cervicalgia 06/16/2012  . Genital herpes in men 11/16/2011    Medications- reviewed and updated Current Outpatient  Prescriptions  Medication Sig Dispense Refill  . clotrimazole (LOTRIMIN) 1 % cream Apply 1 application topically 2 (two) times daily. Use for 2 weeks after resolution.  113 g  2  . pantoprazole (PROTONIX) 40 MG tablet Take 1 tablet (40 mg total) by mouth daily.  30 tablet  5  . valACYclovir (VALTREX) 1000 MG tablet Take 1 tablet (1,000 mg total) by mouth daily.  20 tablet  11   No current facility-administered medications for this visit.    Objective: BP 136/86  Pulse 79  Temp(Src) 98.4 F (36.9 C) (Oral)  Ht 5\' 11"  (1.803 m)  Wt 244 lb (110.678 kg)  BMI 34.05 kg/m2 Gen: NAD, resting comfortably on table, pleasant, talkative CV: RRR no murmurs rubs or gallops Lungs: CTAB no crackles, wheeze, rhonchi Ext: no edema Skin: warm, dry. Previously oval shaped lesion on neck that was 20 x 10cm with raised erythematous border. Lesion has faded but with posterior edge about 7 cm still noted with raised erytematous border. Former nodule that was noted outside of this area has resolved.  GU: 2 normal testicles noted, right testicle displaced backwards by 5 x 4 x 4 cm approximately scrotal mass with spongy texture. No "bag of worms" Neuro: grossly normal, moves all extremities  Assessment/Plan:  Tinea Corporis Encouraged patient to use clotrimazole 2 weeks total past resolution of ringworm on neck. He has only been using intermittently since I saw him in urgent care in December. ? If nodule was actually a lymph node that was reactive to tinea corporis-this has resolved  at thist ime.   GERD (gastroesophageal reflux disease) Symptomatic off medication. Encouraged patient to restart PPI.   Scrotal mass At least 5 cm in length. ? Varicocele. Encouraged patient that we should get baseline ultrasound to assess. He wants to wait until he has insurance so i encouraged him to call us once he has this.   Morbid obesity Agree with patient that regular exercise would benefit him. [facility]  and encouraged 150 mins/wk.   Given history of obesity and a1c at 5.8, will obtain a1c, cmet, lipids once patient has insurance. He states he will call me when this happens.   Meds ordered this encounter  Medications  . pantoprazole (PROTONIX) 40 MG tablet    Sig: Take 1 tablet (40 mg total) by mouth daily.    Dispense:  30 tablet    Refill:  5   Discussed need for this and gave #, patient will wait until he has insurance: Health Maintenance Due  Topic Date Due  . Colonoscopy  02/11/2008

## 2013-10-26 ENCOUNTER — Telehealth: Payer: Self-pay | Admitting: Family Medicine

## 2013-10-26 NOTE — Telephone Encounter (Signed)
Patient c/o non-productive cough.  Informed that he can try Robitussin or Delsym OTC, use throat lozenges, and drink plenty of fluids to help moisten throat.  Patient will call back if sxs worsen/don't improve.  Gaylene Brooksichardson, Jeannette Ann, RN

## 2013-10-26 NOTE — Telephone Encounter (Signed)
Pt has a dry cough. What can he do for that?

## 2014-04-03 ENCOUNTER — Encounter: Payer: Self-pay | Admitting: Family Medicine

## 2014-04-03 ENCOUNTER — Ambulatory Visit (INDEPENDENT_AMBULATORY_CARE_PROVIDER_SITE_OTHER): Payer: BC Managed Care – PPO | Admitting: Family Medicine

## 2014-04-03 VITALS — BP 132/82 | HR 66 | Temp 97.9°F | Ht 71.0 in | Wt 241.0 lb

## 2014-04-03 DIAGNOSIS — R21 Rash and other nonspecific skin eruption: Secondary | ICD-10-CM

## 2014-04-03 DIAGNOSIS — N644 Mastodynia: Secondary | ICD-10-CM | POA: Insufficient documentation

## 2014-04-03 MED ORDER — HYDROCORTISONE VALERATE 0.2 % EX CREA: 1.0000 "application " | TOPICAL_CREAM | Freq: Two times a day (BID) | CUTANEOUS | Status: DC

## 2014-04-03 NOTE — Progress Notes (Signed)
Patient ID: Verdis PrimeShelton D Merriman, male   DOB: 07/25/1956, 58 y.o.   MRN: [id number]  Marikay AlarEric Sonnenberg, MD Phone: [phone]  Verdis PrimeShelton D Shin is a 58 y.o. male who presents today for acute visit.  Rash: patient notes continued rash on neck and right ear. Notes this started on his face. He was seen by Dr Durene CalHunter early this year and treated for a tinea infection with clotrimazole. He states this has helped some though the rash is still present and has moved to his ear and neck. He notes it itches and there is mild erythema. No fevers.  Left nipple discomfort: notes for the last 2 weeks has had discomfort around his left nipple. Feels and looks a little raw to him. Notes he feels like is getting irritated from his shirt with the heat outside. Denies fevers.  Patient is a nonsmoker.   ROS: Per HPI   Physical Exam Filed Vitals:   04/03/14 1131  BP: 132/82  Pulse: 66  Temp: 97.9 F (36.6 C)    Gen: Well NAD Skin: right ear with mild flaking skin on ear lobe, no erythema or swelling, non-tender, posterior neck with scattered papules at hair follicle with no pustules, no tenderness Left nipple with no swelling or erythema, no masses palpated, mildly tender directly over the nipple  Assessment/Plan: Please see individual problem list.  # Healthcare maintenance: not addressed

## 2014-04-03 NOTE — Assessment & Plan Note (Signed)
Patient with tenderness of left nipple without any obvious findings on exam. Discussed use of band-aid for protection of nipple and moisturizer to help with irritation. Reassured that there were no masses on exam. F/u prn.

## 2014-04-03 NOTE — Assessment & Plan Note (Signed)
Patient with fungal appearing rash on right ear and inflammatory lesions of hair follicles without signs of infection. Will continue clotrimazole cream for ear and add hydrocortisone cream as well. Hydrocortisone cream for his neck to help with inflammation. F/u prn if not improving.

## 2014-04-03 NOTE — Patient Instructions (Signed)
Nice to meet you. The rash on your ear appears to be a fungal infection. Please continue to use the clotrimazole cream and start using the hydrocortisone cream prescribed on this area. The rash on the back of your neck appears to be an irritation of the hair follicles. Please apply the hydrocortisone cream to this area as well. If these do not improve please follow-up with your PCP. Please try a band aid over your nipple to help protect this area.

## 2014-04-19 ENCOUNTER — Encounter (HOSPITAL_COMMUNITY): Payer: Self-pay | Admitting: Emergency Medicine

## 2014-04-19 ENCOUNTER — Emergency Department (HOSPITAL_COMMUNITY)
Admission: EM | Admit: 2014-04-19 | Discharge: 2014-04-19 | Disposition: A | Discharge status: Home / Self Care | Payer: BC Managed Care – PPO | Source: Home / Self Care | Attending: Family Medicine | Admitting: Family Medicine

## 2014-04-19 DIAGNOSIS — N433 Hydrocele, unspecified: Secondary | ICD-10-CM

## 2014-04-19 HISTORY — DX: Allergic rhinitis due to pollen: J30.1

## 2014-04-19 NOTE — ED Notes (Signed)
Mass the size of a dime that comes up into his R groin for 2 weeks.   Works in [employer] and is concerned it is a hernia.  Feels like a pulling of a muscle.  He can push it back with his finger.  States he has a mass inside his testicles.  Has had it looked at by a doctor but was told not to worry about it.  Has gotten progressively bigger over the years.  States its hooked to the knot that comes up in his R groin.

## 2014-04-19 NOTE — ED Provider Notes (Signed)
CSN: [id number]     Arrival date & time 04/19/14  1845 History   First MD Initiated Contact with Patient 04/19/14 1947     Chief Complaint  Patient presents with  . Mass   (Consider location/radiation/quality/duration/timing/severity/associated sxs/prior Treatment) Patient is a 58 y.o. male presenting with male genitourinary complaint.  Male GU Problem Presenting symptoms: scrotal pain   Presenting symptoms: no penile discharge and no penile pain   Associated symptoms: scrotal swelling   Associated symptoms: no penile swelling   Associated symptoms comment:  Right testicular swelling gradually over years assoc inguinal sts. No blood, no urinary sx.   Past Medical History  Diagnosis Date  . Allergy 1960    hay fever, weeds and dust  . Kidney stones 2011  . Pain in finger of left hand 02/03/2012    Closed left hand in garage door on 02/03/12.    Marland Kitchen. Hayfever     ragweed,    Past Surgical History  Procedure Laterality Date  . None     Family History  Problem Relation Age of Onset  . Asthma Mother   . Cancer Mother 3170    uterine cancer   . Cancer Maternal Aunt   . Dementia Father 4374    Alzheimers   . Cancer Maternal Uncle     Colon cancer, multiple individuals.    History  Substance Use Topics  . Smoking status: Never Smoker   . Smokeless tobacco: Never Used  . Alcohol Use: Yes     Comment: socially, once a month.     Review of Systems  Constitutional: Negative.   Gastrointestinal: Negative.   Genitourinary: Positive for scrotal swelling and testicular pain. Negative for discharge, penile swelling and penile pain.    Allergies  Review of patient's allergies indicates no known allergies.  Home Medications   Prior to Admission medications   Medication Sig Start Date End Date Taking? Authorizing Provider  clotrimazole (LOTRIMIN) 1 % cream Apply 1 application topically 2 (two) times daily. Use for 2 weeks after resolution. 09/26/13  Yes Shelva MajesticStephen O Hunter, MD   hydrocortisone valerate cream (WESTCORT) 0.2 % Apply 1 application topically 2 (two) times daily. To the back of your neck and right ear for 2 weeks, then as needed twice a day. 04/03/14   Glori LuisEric G Sonnenberg, MD  pantoprazole (PROTONIX) 40 MG tablet Take 1 tablet (40 mg total) by mouth daily. 10/23/13   Shelva MajesticStephen O Hunter, MD   BP 152/80  Pulse 72  Temp(Src) 98.1 F (36.7 C) (Oral)  Resp 16  SpO2 97% Physical Exam  Nursing note and vitals reviewed. Constitutional: He is oriented to person, place, and time. He appears well-developed and well-nourished.  Abdominal: Soft. Bowel sounds are normal. Hernia confirmed negative in the right inguinal area.  Genitourinary: Penis normal. Right testis shows mass, swelling and tenderness. Left testis shows no mass, no swelling and no tenderness. Circumcised.  Lymphadenopathy:       Right: No inguinal adenopathy present.  Neurological: He is alert and oriented to person, place, and time.  Skin: Skin is warm and dry.    ED Course  Procedures (including critical care time) Labs Review Labs Reviewed - No data to display  Imaging Review No results found.   MDM   1. Right hydrocele       Linna HoffJames D Isma Tietje, MD 04/19/14 2025

## 2014-04-19 NOTE — Discharge Instructions (Signed)
See urologist for further removal of mass in scrotum.

## 2015-01-21 ENCOUNTER — Ambulatory Visit (INDEPENDENT_AMBULATORY_CARE_PROVIDER_SITE_OTHER): Payer: Self-pay | Admitting: Family Medicine

## 2015-01-21 VITALS — BP 139/90 | HR 67 | Temp 98.3°F | Ht 71.0 in | Wt 232.3 lb

## 2015-01-21 DIAGNOSIS — J0100 Acute maxillary sinusitis, unspecified: Secondary | ICD-10-CM

## 2015-01-21 DIAGNOSIS — M25562 Pain in left knee: Secondary | ICD-10-CM

## 2015-01-21 MED ORDER — AMOXICILLIN-POT CLAVULANATE 875-125 MG PO TABS: 1.0000 | ORAL_TABLET | Freq: Two times a day (BID) | ORAL | Status: DC

## 2015-01-21 MED ORDER — FEXOFENADINE HCL 180 MG PO TABS: 180.0000 mg | ORAL_TABLET | Freq: Every day | ORAL | Status: DC

## 2015-01-21 MED ORDER — FLUTICASONE PROPIONATE 50 MCG/ACT NA SUSP: 2.0000 | Freq: Every day | NASAL | Status: DC

## 2015-01-21 NOTE — Progress Notes (Signed)
   Subjective:    Patient ID: Roger Hart, male    DOB: 09/09/1956, 59 y.o.   MRN: [id number]  HPI  Patient presents for Same Day Appointment, work in walk-in appt  CC:   # Sinus infection:  Thinks he may have a sinus infection  Woke up at 2am had coughing spell that didn't stop, started getting soreness around his mouth. Cough productive yellow mucous.   Also noted to have some soreness/numbness around lips  Skin peeling and bumps around nose  Tight headache in the back of his head  Had a cough several weeks ago that was darker green sputum. 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 No flu shot. ROS: subjective fever, no CP, a little bit SOB  # Left knee pain  Front of knee  Present for several weeks  Still able to walk on the leg  At work he is leaning on the floor on his knees often but does say he uses cushions/pillows. Review of Systems   See HPI for ROS. All other systems reviewed and are negative.  Past medical history, surgical, family, and social history reviewed and updated in the EMR as appropriate.  Objective:  BP 139/90 mmHg  Pulse 67  Temp(Src) 98.3 F (36.8 C) (Oral)  Ht 5\' 11"  (1.803 m)  Wt 232 lb 4.8 oz (105.371 kg)  BMI 32.41 kg/m2 Vitals and nursing note reviewed  General: NAD HEENT: boggy/erythematous turbinates bilaterally. Minimal tenderness with palpation of sinuses CV: RRR, nl s1s2 no mrg Resp: CTAB no w/r/c MSK: mildly tender over left tibial tuberosity Neuro: alert and oriented, no deficits. CN normal. Sensation actually intact around area of numbness.  Assessment & Plan:  1. Sinus infection: may have sinus infection secondary to allergies or cold several weeks ago. Re-prescribed allergy medicines since he has not  been taking them, OTC decongestant, if this doesn't improve his symptoms over next several days given print out of 5 day augmentin course to take. F/u as needed 2. Left knee pain: unable to fully assess, asked pt to follow up with this issue if conservative treatment does not help over next 1-2 weeks of icing, elevating, OTC pain meds.

## 2015-01-21 NOTE — Patient Instructions (Addendum)
Stay away from the "decongestant" allergy medicines, you can take the decon. meds only for a few days at a time containing the medicine Pseudoephedrine (sudafed).  For allergies, take the allegra and if needed the flonase.  If you are not improving or the pain in your sinuses gets worse, start the augmentin for 5 days.  Nettipot - saline nasal rinse or spray. Pick this up at the pharmacy. Can do this 1-2 times a day.

## 2015-04-08 ENCOUNTER — Ambulatory Visit (INDEPENDENT_AMBULATORY_CARE_PROVIDER_SITE_OTHER): Payer: Self-pay | Admitting: Family Medicine

## 2015-04-08 ENCOUNTER — Encounter: Payer: Self-pay | Admitting: Family Medicine

## 2015-04-08 VITALS — BP 138/78 | HR 79 | Temp 98.2°F | Ht 71.0 in | Wt 235.6 lb

## 2015-04-08 DIAGNOSIS — B354 Tinea corporis: Secondary | ICD-10-CM

## 2015-04-08 DIAGNOSIS — Z79899 Other long term (current) drug therapy: Secondary | ICD-10-CM

## 2015-04-08 DIAGNOSIS — K219 Gastro-esophageal reflux disease without esophagitis: Secondary | ICD-10-CM

## 2015-04-08 DIAGNOSIS — R21 Rash and other nonspecific skin eruption: Secondary | ICD-10-CM

## 2015-04-08 DIAGNOSIS — J302 Other seasonal allergic rhinitis: Secondary | ICD-10-CM

## 2015-04-08 LAB — COMPREHENSIVE METABOLIC PANEL
ALBUMIN: 4 g/dL (ref 3.5–5.2)
ALT: 24 U/L (ref 0–53)
AST: 18 U/L (ref 0–37)
Alkaline Phosphatase: 62 U/L (ref 39–117)
BUN: 16 mg/dL (ref 6–23)
CALCIUM: 9.2 mg/dL (ref 8.4–10.5)
CHLORIDE: 105 meq/L (ref 96–112)
CO2: 27 meq/L (ref 19–32)
Creat: 0.99 mg/dL (ref 0.50–1.35)
Glucose, Bld: 106 mg/dL — ABNORMAL HIGH (ref 70–99)
POTASSIUM: 3.8 meq/L (ref 3.5–5.3)
Sodium: 139 mEq/L (ref 135–145)
Total Bilirubin: 0.7 mg/dL (ref 0.2–1.2)
Total Protein: 6.8 g/dL (ref 6.0–8.3)

## 2015-04-08 MED ORDER — PANTOPRAZOLE SODIUM 40 MG PO TBEC: 40.0000 mg | DELAYED_RELEASE_TABLET | Freq: Every day | ORAL | Status: DC

## 2015-04-08 MED ORDER — TERBINAFINE HCL 250 MG PO TABS: 250.0000 mg | ORAL_TABLET | Freq: Every day | ORAL | Status: AC

## 2015-04-08 NOTE — Assessment & Plan Note (Signed)
Greenish phlegm and belching with h/o GERD, likely mild recurrence. Exam and VS unremarkable -Avoid any trigger foods, tobacco use, NSAIDs, caffeine, carbonated beverages -Pantoprazole  daily -F/u 4 weeks

## 2015-04-08 NOTE — Assessment & Plan Note (Signed)
Sinus symptoms consistent with allergies vs mild viral sinusitis. Afebrile, well-appearing. -Continue neti pot as this helped before. -Flonase daily. -F/u 4 weeks.

## 2015-04-08 NOTE — Assessment & Plan Note (Addendum)
Skin findings somewhat difficult to interpret due to use of steroid cream and antifungal before today's exam, but with classic tinea corporis on neck, that is also likely what is on back. Very itchy. No systemic symptoms. Does not look like shingles which would also be abnormal to be found bilaterally. -KOH skin scraping -Empiric Terbinafine 250mg  daily x 2-4 weeks given reported "long" duration of symptoms. -CMET to make sure LFTs normal.  -F/u 4 weeks.

## 2015-04-08 NOTE — Patient Instructions (Signed)
Use the flonase and continue neti pot.  Use pantoprazole every night on empty stomach. Avoid high-acidic foods, anti-inflammatory medicines (ibuprofen, naproxen, advil), alcohol, and caffeine and carbonation.  Use the antifungal medication for 4 weeks then follow up to see how you are doing.  Best,  Leona SingletonMaria T Tenessa Marsee, MD

## 2015-04-08 NOTE — Progress Notes (Signed)
Patient ID: ILYA UNANGST, male   DOB: Jan 25, 1956, 59 y.o.   MRN: [id number] Subjective:   CC: Sour stomach, sinus irritation, back burning pain  HPI:   Stomach burning Sameday visit for about 1 day of stomach burning with greenish phlegm after belching. Has not had these symptoms in a while. Previuosly on protonix 40mg  daily but stopped. Has been coughing a little today. Also with some nausea that resolved with water. Denies fevers, chills, vomiting, alcohol intake (except occasionally), NSAID use, tobacco use, coffee intake, and only drinks 1 can sprite daily.   Sinus irritation Also with sinus irritation that improved with neti pot without need for antibiotics after April visit but symptoms somewhat recurring. Seems to be bothering him worse in the past few months. No fevers/chills or dyspnea.  Burning pain on back Yesterday, when setting down a watermelon, felt a burning near his back/shoulder, worse with deep inspiration, that lasted all day. Eased off today. Has a rash on this area that looks like ringworm and spread days ago from left side of his neck. No fevers, chills, nausea, vomiting, or dyspnea. No chest pain. Itchy but burns after scratching for a while. Tried steroid and anti-fungal (both were rx strength) which did not help, though unclear how long he has dealt with this (not giving me clear answer).  Review of Systems - Per HPI.   PMH - allergic rhinitis, genital herpes, cervicalgia, scrotal mass, GERD, morbid obesity, rash, nipple tenderness SH: Nonsmoker    Objective:  Physical Exam BP 138/78 mmHg  Pulse 79  Temp(Src) 98.2 F (36.8 C) (Oral)  Ht 5\' 11"  (1.803 m)  Wt 235 lb 9 oz (106.85 kg)  BMI 32.87 kg/m2 GEN: NAD, talkative CV: RRR, no m/r/g, 2+ B radial pulse PULM: CTAB, normal effort, no wheezes or crackles ABD: S/NT/ND, NABS EXTR: No LE edema or calf tenderness HEENT: AT/[place], sclera clear, EOMI, o/p clear with MMM, nares patent with boggy turbinates and clear  rhinorrhea, neck supple, no LAD or tenderness in neck, no thyromegaly SKIN: Left>R upper back in distribution similar to neckline of shirt but below this and wider distribution, with mild raised patches of erythema appearing like chronic irritation with occasional papules on erythematous base on left side of back; no vesicles; no induration or warmth. Mildly dried-appearing. Right side of this appears shiny and slightly atrophied. Left side of neck with 1.5 cm annular flaky lesion     Assessment:     IZEA BRENSINGER is a 59 y.o. male here for multiple issues.    Plan:     # See problem list and after visit summary for problem-specific plans.   Follow-up: Follow up in 4 weeks for f/u of symptoms.   Leona Singleton, MD [facility]

## 2015-04-09 LAB — POCT SKIN KOH: Skin KOH, POC: POSITIVE

## 2015-04-09 NOTE — Addendum Note (Signed)
Addended by: Simone CuriaHEKKEKANDAM, Yerlin Gasparyan T on: 04/09/2015 08:46 AM   Modules accepted: Orders

## 2015-04-11 ENCOUNTER — Telehealth: Payer: Self-pay | Admitting: Family Medicine

## 2015-04-11 NOTE — Telephone Encounter (Signed)
Called to notify patient CMET normal and KOH skin scraping positive. He states he is taking terbinafine daily as prescribed. Urged him to f/u as we discussed if symptoms not improving.  Leona SingletonMaria T Teresa Lemmerman, MD

## 2015-11-14 ENCOUNTER — Ambulatory Visit (INDEPENDENT_AMBULATORY_CARE_PROVIDER_SITE_OTHER): Payer: Self-pay | Admitting: Student

## 2015-11-14 ENCOUNTER — Encounter: Payer: Self-pay | Admitting: Student

## 2015-11-14 VITALS — BP 133/76 | HR 74 | Temp 98.4°F | Wt 232.0 lb

## 2015-11-14 DIAGNOSIS — M79601 Pain in right arm: Secondary | ICD-10-CM

## 2015-11-14 MED ORDER — CETIRIZINE HCL 10 MG PO TABS: 10.0000 mg | ORAL_TABLET | Freq: Every day | ORAL | Status: AC

## 2015-11-14 NOTE — Progress Notes (Signed)
   Subjective:    Patient ID: Roger Hart, male    DOB: Jun 07, 1956, 60 y.o.   MRN: [id number]   CC: cough  HPI: 60 year old man with a history of allergic rhinitis presents for cough  for the last 2 weeks   Cough and congestion - he had a an upper respiratory tract infection that started one month ago has gotten better however he continues to have cough - he does have allergic rhinitis. And does not take anything for it. He is very interested in natural supplements - He does use an antibiotic which she feels has helped a lot - He denies fevers, chills, sore throat, ear pain  Right elbow pain - he also mentions right elbow pain that he has had for the last month. -He has had pain like this before treated with Tylenol and it improved - He did not try any medicine for this pain - The pain is located mostly over the muscles surrounding his elbow - He denies weakness, and can think of no predisposing factors - He does report that he is very active and this pain has not restricted his typical activities - reported that 2 days ago he lifted a cooler full of water out of his pickup truck with his right arm, and while his arm was sore doing this, he had no difficulty performing this  Review of Systems ROS Per history of present illness, otherwise he denies nausea vomiting diarrhea, weakness, headache Past Medical, Surgical, Social, and Family History Reviewed & Updated per EMR.   Objective:  BP 133/76 mmHg  Pulse 74  Temp(Src) 98.4 F (36.9 C) (Oral)  Wt 232 lb (105.235 kg) Vitals and nursing note reviewed  General: NAD HEENT: normal TMs bilaterally, no cervical or submandibular lymphadenopathy, normal oropharynx,  Cardiac: RRR,  Respiratory: CTAB, normal effort Abdomen: soft, nontender, nondistended, Bowel sounds present Extremities: %/5 upper extremity strength bilaterally, no tenderness to palpation over the right elbow joint, mild tenderness to palpation over the  brachioradialis muscle, normal ROM bilaterally, no swelling, erythema, or warmth Skin: warm and dry, no rashes noted Neuro: alert and oriented, no focal deficits   Assessment & Plan:    Allergic rhinitis History and physical exam consistent with allergic rhinitis. - Will treat with cetirizine. - if limited effect will consider intranasal corticosteroids  Right arm pain Right arm pain mostly located oriented over the superior aspect of brachioradialis muscle. No bony tenderness or tenderness over the joint. Making arthritis or bursitis less likely. No swelling erythema or warmth at the area making infection less likely. Potentially due to muscle strain. - We'll follow-up with PCP and if pain not resolved by then will continue to workup     Demiana Crumbley A. Kennon Rounds MD, MS Family Medicine Resident PGY-2 Pager [phone]

## 2015-11-14 NOTE — Assessment & Plan Note (Signed)
History and physical exam consistent with allergic rhinitis. - Will treat with cetirizine. - if limited effect will consider intranasal corticosteroids

## 2015-11-14 NOTE — Assessment & Plan Note (Addendum)
Right arm pain mostly located oriented over the superior aspect of brachioradialis muscle. No bony tenderness or tenderness over the joint. Making arthritis or bursitis less likely. No swelling erythema or warmth at the area making infection less likely. Potentially due to muscle strain. - We'll follow-up with PCP and if pain not resolved by then will continue to workup

## 2015-11-14 NOTE — Patient Instructions (Signed)
Follow-up with primary care provider in one week for right elbow soreness  Your cough is likely due to your allergic rhinitis. You were prescribed cetirizine for this.  if you have any questions or concerns please call the office at [phone]

## 2015-11-22 ENCOUNTER — Ambulatory Visit: Payer: Self-pay | Admitting: Family Medicine

## 2017-11-12 DIAGNOSIS — K4090 Unilateral inguinal hernia, without obstruction or gangrene, not specified as recurrent: Secondary | ICD-10-CM | POA: Insufficient documentation

## 2022-01-19 ENCOUNTER — Ambulatory Visit (INDEPENDENT_AMBULATORY_CARE_PROVIDER_SITE_OTHER): Payer: Self-pay | Admitting: Family Medicine

## 2022-01-19 ENCOUNTER — Ambulatory Visit
Admission: RE | Admit: 2022-01-19 | Discharge: 2022-01-19 | Disposition: A | Discharge status: Home / Self Care | Payer: Self-pay | Source: Ambulatory Visit | Attending: Family Medicine | Admitting: Family Medicine

## 2022-01-19 ENCOUNTER — Ambulatory Visit
Admission: RE | Admit: 2022-01-19 | Discharge: 2022-01-19 | Disposition: A | Discharge status: Home / Self Care | Payer: Self-pay | Attending: Family Medicine | Admitting: Family Medicine

## 2022-01-19 ENCOUNTER — Encounter: Payer: Self-pay | Admitting: Family Medicine

## 2022-01-19 VITALS — BP 132/82 | HR 76 | Ht 71.0 in | Wt 237.0 lb

## 2022-01-19 DIAGNOSIS — M79645 Pain in left finger(s): Secondary | ICD-10-CM

## 2022-01-19 MED ORDER — MELOXICAM 15 MG PO TABS: 15.0000 mg | ORAL_TABLET | Freq: Every day | ORAL | 0 refills | Status: DC

## 2022-01-19 MED ORDER — MELOXICAM 15 MG PO TABS: 15.0000 mg | ORAL_TABLET | Freq: Every day | ORAL | 0 refills | Status: AC

## 2022-01-19 MED ORDER — MELOXICAM 15 MG PO TABS
15.0000 mg | ORAL_TABLET | Freq: Every day | ORAL | 0 refills | Status: DC
Start: 2022-01-19 — End: 2022-01-19

## 2022-01-19 NOTE — Assessment & Plan Note (Signed)
Patient presents with acute on chronic left first digit basal pain, most recent episode of symptoms have been ongoing for roughly 6 months.  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  He has noted pain, swelling, no paresthesias, mild radiation to the radial forearm to the elbow.  Treatment has included OTC meds sporadically with mixed response. ? ?Examination shows swelling at the right basal joint when compared to contralateral, no skin discoloration, range of motion is full and painful, maximal tenderness that recreates his pain at the first [facility], minor tenderness at the first MCP, full strength, sensation intact and symmetric, positive basal joint grind, provocative testing negative otherwise. ? ?History and findings are most consistent with first CMC pain most likely the setting of osteoarthritis, dedicated x-rays ordered, plan for meloxicam scheduled, and thumb spica usage while active.  Supportive care also discussed.  He will return for follow-up in a few weeks for reevaluation, if still symptomatic despite these measures, pending x-rays, anticipate corticosteroid injection. ? ?Chronic condition, symptomatic, Rx management ?

## 2022-01-19 NOTE — Progress Notes (Signed)
?  ? ?  Primary Care / Sports Medicine Office Visit ? ?Patient Information:  ?Patient ID: Roger Hart, male DOB: 17-Jun-1956 Age: 66 y.o. MRN: [id number]  ? ?Roger Hart is a pleasant 66 y.o. male presenting with the following: ? ?Chief Complaint  ?Patient presents with  ? Hand Pain  ?  Left hand pain for 6 months, no known injury. Does lay concrete, work cars.   ? ? ?Vitals:  ? 01/19/22 1122  ?BP: 132/82  ?Pulse: 76  ?SpO2: 98%  ? ?Vitals:  ? 01/19/22 1122  ?Weight: 237 lb (107.5 kg)  ?Height: 5\' 11"  (1.803 m)  ? ?Body mass index is 33.05 kg/m?. ? ?No results found.  ? ?Independent interpretation of notes and tests performed by another provider:  ? ?None ? ?Procedures performed:  ? ?None ? ?Pertinent History, Exam, Impression, and Recommendations:  ? ?Thumb pain, left ?Patient presents with acute on chronic left first digit basal pain, most recent episode of symptoms have been ongoing for roughly 6 months.  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  He has noted pain, swelling, no paresthesias, mild radiation to the radial forearm to the elbow.  Treatment has included OTC meds sporadically with mixed response. ? ?Examination shows swelling at the right basal joint when compared to contralateral, no skin discoloration, range of motion is full and painful, maximal tenderness that recreates his pain at the first [facility], minor tenderness at the first MCP, full strength, sensation intact and symmetric, positive basal joint grind, provocative testing negative otherwise. ? ?History and findings are most consistent with first CMC pain most likely the setting of osteoarthritis, dedicated x-rays ordered, plan for meloxicam scheduled, and thumb spica usage while active.  Supportive care also discussed.  He will return for follow-up in a few weeks for reevaluation, if still symptomatic despite these measures, pending  x-rays, anticipate corticosteroid injection. ? ?Chronic condition, symptomatic, Rx management  ? ?Orders & Medications ?Meds ordered this encounter  ?Medications  ? DISCONTD: meloxicam (MOBIC) 15 MG tablet  ?  Sig: Take 1 tablet (15 mg total) by mouth daily.  ?  Dispense:  14 tablet  ?  Refill:  0  ? DISCONTD: meloxicam (MOBIC) 15 MG tablet  ?  Sig: Take 1 tablet (15 mg total) by mouth daily.  ?  Dispense:  14 tablet  ?  Refill:  0  ? meloxicam (MOBIC) 15 MG tablet  ?  Sig: Take 1 tablet (15 mg total) by mouth daily.  ?  Dispense:  14 tablet  ?  Refill:  0  ? ?Orders Placed This Encounter  ?Procedures  ? DG Hand Complete Right  ? DG Hand Complete Left  ?  ? ?No follow-ups on file.  ?  ? ?[facility], MD ? ? Primary Care Sports Medicine ?[facility] ?[facility]  ? ?

## 2022-01-19 NOTE — Patient Instructions (Signed)
-   Obtain x-rays today ?- Wear thumb spica brace throughout the day, okay to remove for driving, hygiene, and sleeping ?- Start meloxicam, take once daily with food (no other NSAIDs while this medication) ?- Return for follow-up in 2 weeks, contact us for any questions between now and then ?

## 2022-01-21 NOTE — Progress Notes (Signed)
Called pt could not leave VM. VM full. ? ?PEC nurse may give results to patient if they return call to clinic, a CRM has been created. ? ?KP

## 2022-02-02 ENCOUNTER — Telehealth: Payer: Self-pay

## 2022-02-02 NOTE — Telephone Encounter (Signed)
Spoke with pt. He will keep his appointment for 02/09/2022. If need be he will make an appointment sooner.  ?

## 2022-02-02 NOTE — Telephone Encounter (Signed)
Pt called returning our call. Pt stated that he was receiving a call [facility] and he went to that call. Pt will call back if needed. ?

## 2022-02-02 NOTE — Telephone Encounter (Signed)
Please advise, BP going up on medication but states it is working.

## 2022-02-02 NOTE — Telephone Encounter (Signed)
Patient called in about his blood pressure 153/92 which has elevated since started taking the meloxicam he stated that he has two pills left. He thinks that the medication is working for him. ?

## 2022-02-06 ENCOUNTER — Ambulatory Visit: Payer: Self-pay | Admitting: Family Medicine

## 2022-02-09 ENCOUNTER — Ambulatory Visit: Payer: Self-pay | Admitting: Family Medicine
